# Patient Record
Sex: Female | Born: 1990 | Race: Black or African American | Hispanic: No | Marital: Single | Smoking: Never smoker
Health system: Southern US, Community
[De-identification: ages and names within clinical notes are randomized; demographics above are authoritative.]

---

## 2016-07-30 ENCOUNTER — Emergency Department (HOSPITAL_COMMUNITY): Payer: BLUE CROSS/BLUE SHIELD

## 2016-07-30 ENCOUNTER — Telehealth (HOSPITAL_BASED_OUTPATIENT_CLINIC_OR_DEPARTMENT_OTHER): Payer: Self-pay | Admitting: Emergency Medicine

## 2016-07-30 ENCOUNTER — Encounter (HOSPITAL_COMMUNITY): Payer: Self-pay

## 2016-07-30 ENCOUNTER — Inpatient Hospital Stay (HOSPITAL_COMMUNITY)
Admission: EM | Admit: 2016-07-30 | Discharge: 2016-07-31 | DRG: 084 | Disposition: A | Discharge status: Home / Self Care | Payer: BLUE CROSS/BLUE SHIELD | Attending: Surgery | Admitting: Surgery

## 2016-07-30 DIAGNOSIS — F1010 Alcohol abuse, uncomplicated: Secondary | ICD-10-CM | POA: Diagnosis present

## 2016-07-30 DIAGNOSIS — R413 Other amnesia: Secondary | ICD-10-CM | POA: Diagnosis present

## 2016-07-30 DIAGNOSIS — S066X9A Traumatic subarachnoid hemorrhage with loss of consciousness of unspecified duration, initial encounter: Principal | ICD-10-CM | POA: Diagnosis present

## 2016-07-30 DIAGNOSIS — R079 Chest pain, unspecified: Secondary | ICD-10-CM | POA: Diagnosis present

## 2016-07-30 DIAGNOSIS — I609 Nontraumatic subarachnoid hemorrhage, unspecified: Secondary | ICD-10-CM

## 2016-07-30 DIAGNOSIS — Y92410 Unspecified street and highway as the place of occurrence of the external cause: Secondary | ICD-10-CM | POA: Diagnosis not present

## 2016-07-30 DIAGNOSIS — S069XAA Unspecified intracranial injury with loss of consciousness status unknown, initial encounter: Secondary | ICD-10-CM | POA: Insufficient documentation

## 2016-07-30 DIAGNOSIS — S069X9A Unspecified intracranial injury with loss of consciousness of unspecified duration, initial encounter: Secondary | ICD-10-CM | POA: Insufficient documentation

## 2016-07-30 DIAGNOSIS — M542 Cervicalgia: Secondary | ICD-10-CM

## 2016-07-30 DIAGNOSIS — F10920 Alcohol use, unspecified with intoxication, uncomplicated: Secondary | ICD-10-CM

## 2016-07-30 LAB — CBC WITH DIFFERENTIAL/PLATELET
Basophils Absolute: 0 10*3/uL (ref 0.0–0.1)
Basophils Relative: 1 %
Eosinophils Absolute: 0.1 10*3/uL (ref 0.0–0.7)
Eosinophils Relative: 1 %
HCT: 39.7 % (ref 36.0–46.0)
HEMOGLOBIN: 13.4 g/dL (ref 12.0–15.0)
LYMPHS ABS: 2.4 10*3/uL (ref 0.7–4.0)
LYMPHS PCT: 40 %
MCH: 29.9 pg (ref 26.0–34.0)
MCHC: 33.8 g/dL (ref 30.0–36.0)
MCV: 88.6 fL (ref 78.0–100.0)
Monocytes Absolute: 0.4 10*3/uL (ref 0.1–1.0)
Monocytes Relative: 7 %
NEUTROS PCT: 51 %
Neutro Abs: 3.1 10*3/uL (ref 1.7–7.7)
Platelets: 246 10*3/uL (ref 150–400)
RBC: 4.48 MIL/uL (ref 3.87–5.11)
RDW: 12.8 % (ref 11.5–15.5)
WBC: 6 10*3/uL (ref 4.0–10.5)

## 2016-07-30 LAB — BASIC METABOLIC PANEL
Anion gap: 7 (ref 5–15)
BUN: 8 mg/dL (ref 6–20)
CHLORIDE: 109 mmol/L (ref 101–111)
CO2: 26 mmol/L (ref 22–32)
Calcium: 9.3 mg/dL (ref 8.9–10.3)
Creatinine, Ser: 0.9 mg/dL (ref 0.44–1.00)
GFR calc non Af Amer: >60 mL/min (ref >60)
Glucose, Bld: 110 mg/dL — ABNORMAL HIGH (ref 65–99)
POTASSIUM: 3.5 mmol/L (ref 3.5–5.1)
SODIUM: 142 mmol/L (ref 135–145)

## 2016-07-30 LAB — I-STAT BETA HCG BLOOD, ED (MC, WL, AP ONLY): I-stat hCG, quantitative: <5 m[IU]/mL (ref <5)

## 2016-07-30 LAB — MRSA PCR SCREENING: MRSA by PCR: NEGATIVE

## 2016-07-30 LAB — ETHANOL: ALCOHOL ETHYL (B): 208 mg/dL — AB (ref <5)

## 2016-07-30 MED ORDER — ONDANSETRON HCL 4 MG/2ML IJ SOLN
4.0000 mg | Freq: Once | INTRAMUSCULAR | Status: AC
Start: 2016-07-30 — End: 2016-07-30
  Administered 2016-07-30: 4 mg via INTRAVENOUS
  Filled 2016-07-30: qty 2

## 2016-07-30 MED ORDER — ONDANSETRON HCL 4 MG/2ML IJ SOLN
4.0000 mg | Freq: Four times a day (QID) | INTRAMUSCULAR | Status: DC | PRN
  Administered 2016-07-30 (×2): 4 mg via INTRAVENOUS
  Filled 2016-07-30 (×2): qty 2

## 2016-07-30 MED ORDER — SODIUM CHLORIDE 0.9 % IV BOLUS (SEPSIS)
1000.0000 mL | Freq: Once | INTRAVENOUS | Status: AC
  Administered 2016-07-30: 1000 mL via INTRAVENOUS

## 2016-07-30 MED ORDER — IOPAMIDOL (ISOVUE-300) INJECTION 61%
INTRAVENOUS | Status: AC
  Administered 2016-07-30: 100 mL
  Filled 2016-07-30: qty 100

## 2016-07-30 MED ORDER — ONDANSETRON 4 MG PO TBDP
8.0000 mg | ORAL_TABLET | Freq: Once | ORAL | Status: AC
  Administered 2016-07-30: 8 mg via ORAL
  Filled 2016-07-30: qty 2

## 2016-07-30 MED ORDER — HYDROCODONE-ACETAMINOPHEN 5-325 MG PO TABS
1.0000 | ORAL_TABLET | ORAL | Status: DC | PRN
  Administered 2016-07-30 – 2016-07-31 (×3): 2 via ORAL
  Filled 2016-07-30 (×3): qty 2

## 2016-07-30 MED ORDER — INFLUENZA VAC SPLIT QUAD 0.5 ML IM SUSY
0.5000 mL | PREFILLED_SYRINGE | INTRAMUSCULAR | Status: DC
  Filled 2016-07-30: qty 0.5

## 2016-07-30 MED ORDER — HYDROMORPHONE HCL 1 MG/ML IJ SOLN: 1.0000 mg | INTRAMUSCULAR | Status: DC | PRN

## 2016-07-30 MED ORDER — ONDANSETRON HCL 4 MG PO TABS: 4.0000 mg | ORAL_TABLET | Freq: Four times a day (QID) | ORAL | Status: DC | PRN

## 2016-07-30 MED ORDER — KCL IN DEXTROSE-NACL 20-5-0.9 MEQ/L-%-% IV SOLN
INTRAVENOUS | Status: DC
  Administered 2016-07-30 – 2016-07-31 (×2): via INTRAVENOUS
  Filled 2016-07-30 (×2): qty 1000
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
## 2016-07-30 NOTE — ED Notes (Signed)
Patient transported to CT 
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
## 2016-07-31 ENCOUNTER — Inpatient Hospital Stay (HOSPITAL_COMMUNITY): Payer: BLUE CROSS/BLUE SHIELD

## 2016-07-31 MED ORDER — WHITE PETROLATUM GEL
Status: AC
  Administered 2016-07-31: 10:00:00
  Filled 2016-07-31: qty 1

## 2016-07-31 MED ORDER — HYDROCODONE-ACETAMINOPHEN 5-325 MG PO TABS: 1.0000 | ORAL_TABLET | ORAL | 0 refills | Status: AC | PRN

## 2016-07-31 MED ORDER — HYDROCODONE-ACETAMINOPHEN 5-325 MG PO TABS: 0.5000 | ORAL_TABLET | ORAL | Status: DC | PRN

## 2016-07-31 MED ORDER — HYDROMORPHONE HCL 1 MG/ML IJ SOLN: 0.5000 mg | INTRAMUSCULAR | Status: DC | PRN

## 2016-07-31 MED ORDER — POLYETHYLENE GLYCOL 3350 17 G PO PACK: 17.0000 g | PACK | Freq: Every day | ORAL | Status: DC

## 2016-07-31 MED ORDER — DOCUSATE SODIUM 100 MG PO CAPS: 100.0000 mg | ORAL_CAPSULE | Freq: Two times a day (BID) | ORAL | Status: DC

## 2016-07-31 NOTE — Progress Notes (Signed)
Report given to 5222m nurse at this time.  Pt has no s/s of any acute distress.  No c/o pain at this time.

## 2016-07-31 NOTE — Progress Notes (Signed)
Discharge instructions reviewed with patient/family at this time. All questions answered. RX given. Transport by home.   Sanaya Gwilliam, RN.

## 2016-07-31 NOTE — Progress Notes (Signed)
Well. Will control no new neurologic symptoms.  Follow-up head CT scan with essential resolution of traumatic subarachnoid hemorrhage. No evidence of cortical contusion, edema or mass effect.  Status post traumatic brain injury, mild. May be transferred for mobilized ad lib. Okay for discharge from my standpoint. Follow-up with me as needed.

## 2016-07-31 NOTE — Progress Notes (Signed)
   07/31/16 1511  Pressure Ulcer Prevention  Repositioned Sitting;Standing  Positioning Frequency Able to turn self  Mobility  Activity Ambulate in hall;Ambulate in room;Back to bed  Level of Assistance Independent  Assistive Device None  Distance Ambulated (ft) 200 ft  Bed Position Semi-fowlers  Range of Motion Active;All extremities

## 2016-07-31 NOTE — Progress Notes (Signed)
Pt arrived to 5M20 via wheelchair, Safety precautions and orders reviewed with patient. VSS. PT denied pain at this time. Will continue to monitor.   Sim BoastHavy, RN
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
## 2016-07-31 NOTE — Discharge Instructions (Signed)
No driving while taking hydrocodone. °
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
## 2016-08-29 ENCOUNTER — Telehealth (HOSPITAL_COMMUNITY): Payer: Self-pay

## 2016-08-29 NOTE — Telephone Encounter (Signed)
Referred her to Dr. Jordan LikesPool to f/u.
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
